# Patient Record
Sex: Male | Born: 1985 | Race: White | Hispanic: No | Marital: Single | Smoking: Current every day smoker
Health system: Southern US, Community
[De-identification: ages and names within clinical notes are randomized; demographics above are authoritative.]

## PROBLEM LIST (undated history)

## (undated) DIAGNOSIS — J45909 Unspecified asthma, uncomplicated: Secondary | ICD-10-CM

## (undated) DIAGNOSIS — Z973 Presence of spectacles and contact lenses: Secondary | ICD-10-CM

## (undated) DIAGNOSIS — T7840XA Allergy, unspecified, initial encounter: Secondary | ICD-10-CM

## (undated) DIAGNOSIS — F419 Anxiety disorder, unspecified: Secondary | ICD-10-CM

## (undated) DIAGNOSIS — F988 Other specified behavioral and emotional disorders with onset usually occurring in childhood and adolescence: Secondary | ICD-10-CM

## (undated) HISTORY — PX: WISDOM TOOTH EXTRACTION: SHX21

## (undated) HISTORY — DX: Unspecified asthma, uncomplicated: J45.909

## (undated) HISTORY — DX: Other specified behavioral and emotional disorders with onset usually occurring in childhood and adolescence: F98.8

## (undated) HISTORY — PX: DENTAL SURGERY: SHX609

## (undated) HISTORY — DX: Anxiety disorder, unspecified: F41.9

## (undated) HISTORY — DX: Allergy, unspecified, initial encounter: T78.40XA

## (undated) HISTORY — DX: Presence of spectacles and contact lenses: Z97.3

---
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
## 2011-08-29 ENCOUNTER — Encounter (INDEPENDENT_AMBULATORY_CARE_PROVIDER_SITE_OTHER): Payer: Self-pay | Admitting: Surgery

## 2011-08-30 ENCOUNTER — Encounter (INDEPENDENT_AMBULATORY_CARE_PROVIDER_SITE_OTHER): Payer: Self-pay | Admitting: General Surgery

## 2011-08-30 ENCOUNTER — Ambulatory Visit (INDEPENDENT_AMBULATORY_CARE_PROVIDER_SITE_OTHER): Payer: 59 | Admitting: General Surgery

## 2011-08-30 VITALS — BP 122/74 | HR 68 | Temp 97.8°F | Resp 18 | Ht 74.0 in | Wt 206.2 lb

## 2011-08-30 DIAGNOSIS — L0591 Pilonidal cyst without abscess: Secondary | ICD-10-CM

## 2011-08-30 DIAGNOSIS — L0592 Pilonidal sinus without abscess: Secondary | ICD-10-CM

## 2011-08-30 NOTE — Patient Instructions (Signed)
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 It is not infected at this time. It is unlikely that this will heal spontaneously. This will predispose  to infections in the future.  We have discussed definitive surgery and what is involved and that. This surgery has been offered to you electively.   If you decide to have this surgery please call the office and return to see me for a preop visit.   Pilonidal Cyst A pilonidal cyst occurs when hairs get trapped (ingrown) beneath the skin in the crease between the buttocks over your sacrum (the bone under that crease). Pilonidal cysts are most common in young men with a lot of body hair. When the cyst is ruptured (breaks) or leaking, fluid from the cyst may cause burning and itching. If the cyst becomes infected, it causes a painful swelling filled with pus (abscess). The pus and trapped hairs need to be removed (often by lancing) so that the infection can heal. However, recurrence is common and an operation may be needed to remove the cyst. HOME CARE INSTRUCTIONS   If the cyst was NOT INFECTED:   Keep the area clean and dry. Bathe or shower daily. Wash the area well with a germ-killing soap. Warm tub baths may help prevent infection and help with drainage. Dry the area well with a towel.   Avoid tight clothing to keep area as moisture free as possible.   Keep area between buttocks as free of hair as possible. A depilatory may be used.   If the cyst WAS INFECTED and needed to be drained:   Your caregiver packed the wound with gauze to keep the wound open. This allows the wound to heal from the inside outwards and continue draining.   Return for a wound check in 1 day or as suggested.   If you take tub baths or showers, repack the wound with gauze following them. Sponge baths (at the sink) are a good alternative.   If an antibiotic was ordered to fight the infection, take as directed.   Only take over-the-counter or prescription medicines for pain,  discomfort, or fever as directed by your caregiver.   After the drain is removed, use sitz baths for 20 minutes 4 times per day. Clean the wound gently with mild unscented soap, pat dry, and then apply a dry dressing.  SEEK MEDICAL CARE IF:   You have increased pain, swelling, redness, drainage, or bleeding from the area.   You have a fever.   You have muscles aches, dizziness, or a general ill feeling.  Document Released: 05/06/2000 Document Revised: 04/28/2011 Document Reviewed: 07/04/2008 [facility] Patient Information 2012 [place], [place].

## 2011-08-30 NOTE — Progress Notes (Signed)
Patient ID: Malik Becker, male   DOB: Dec 04, 1985, 26 y.o.   MRN: [id number]  Chief Complaint  Patient presents with  . Follow-up    Pilonidal cyst    HPI Malik Becker is a 26 y.o. male.  He is referred by Dr. Para Skeans for evaluation of a chronic pilonidal sinus.  The patient states that he's had an opening in the presacral area for 10 years. It has drained once. It will occasionally get painful and then resolve. Occasionally sees a little bit of blood on his underwear. He has never had surgical intervention. He denies having any infection in this area. He denies any rectal disease. He is not in acute distress today.  He simply states he has not shown this to any one until last week when he showed up to Dr. Terri Piedra who told him he had pilonidal disease.  Patient is otherwise healthy. He is single. His mother lives in the area. HPI  Past Medical History  Diagnosis Date  . Pilonidal cyst     Past Surgical History  Procedure Date  . Finger fracture surgery 2006    pinky    Family History  Problem Relation Age of Onset  . Asthma Mother   . Stroke Mother   . Diabetes Maternal Grandmother   . Diabetes Paternal Grandmother     boarderline  . Alzheimer's disease Paternal Grandfather   . Cancer Other     breast  . Glaucoma Other     Social History History  Substance Use Topics  . Smoking status: Current Everyday Smoker  . Smokeless tobacco: Never Used  . Alcohol Use: Yes     rare    No Known Allergies  Current Outpatient Prescriptions  Medication Sig Dispense Refill  . dextroamphetamine (DEXTROSTAT) 10 MG tablet Take 10 mg by mouth 3 (three) times daily. Two pills per dosage      . FLUoxetine HCl 60 MG TABS Take by mouth daily.        Review of Systems Review of Systems  Constitutional: Negative for fever, chills and unexpected weight change.  HENT: Negative for hearing loss, congestion, sore throat, trouble swallowing and voice change.   Eyes: Negative for  visual disturbance.  Respiratory: Negative for cough and wheezing.   Cardiovascular: Negative for chest pain, palpitations and leg swelling.  Gastrointestinal: Negative for nausea, vomiting, abdominal pain, diarrhea, constipation, blood in stool, abdominal distention, anal bleeding and rectal pain.  Genitourinary: Negative for hematuria and difficulty urinating.  Musculoskeletal: Negative for arthralgias.  Skin: Positive for wound. Negative for rash.  Neurological: Negative for seizures, syncope, weakness and headaches.  Hematological: Negative for adenopathy. Does not bruise/bleed easily.  Psychiatric/Behavioral: Negative for confusion.    Blood pressure 122/74, pulse 68, temperature 97.8 F (36.6 C), temperature source Temporal, resp. rate 18, height 6\' 2"  (1.88 m), weight 206 lb 4 oz (93.554 kg).  Physical Exam Physical Exam  Constitutional: He is oriented to person, place, and time. He appears well-developed and well-nourished. No distress.  Eyes: EOM are normal. No scleral icterus.  Neck: Normal range of motion. Neck supple. No JVD present. No tracheal deviation present. No thyromegaly present.  Cardiovascular: Normal rate, regular rhythm, normal heart sounds and intact distal pulses.   No murmur heard. Pulmonary/Chest: Effort normal and breath sounds normal. No stridor. No respiratory distress. He has no wheezes. He has no rales. He exhibits no tenderness.  Abdominal: Soft. Bowel sounds are normal. He exhibits no distension and no mass.  There is no tenderness. There is no rebound and no guarding.  Musculoskeletal: Normal range of motion. He exhibits no edema and no tenderness.  Lymphadenopathy:    He has no cervical adenopathy.  Neurological: He is alert and oriented to person, place, and time. He has normal reflexes. Coordination normal.  Skin: Skin is warm and dry. No rash noted. He is not diaphoretic. No erythema. No pallor.       There are no scars or chronic draining areas in  either the axilla or the inguinal areas. In the pilonidal area there are 2 openings in the midline and one is approximately 4 cm away from the anal verge and another one is about 8 or 9 cm away from the anal verge. There is no erythema or purulence. I tried to probe these both toward the rectum and proximally and could not force the probe very far. This was a little bit painful. There is no mass.  Psychiatric: He has a normal mood and affect. His behavior is normal. Judgment and thought content normal.    Data Reviewed None available  Assessment    Chronic pilonidal sinuses. Not actively infected at this time.    Plan    I explained the diagnosis and the pathophysiology of pilonidal disease. I told him that to treat these definitively would require excision and healing by secondary intention and this would take 6-8 weeks of wound packing. I discussed the natural history of this process if nothing is done including the possibility of painful abscess he is an eventual surgery.  At this time he wants to go home and think about definitive surgery. He does not want to do that at this time.  He was offered this surgery at this time if he so shows. He will call me p.r.n.       Angelia Mould. Derrell Lolling, M.D., [facility] General and Minimally invasive Surgery Breast and Colorectal Surgery Office:   [phone] Pager:   [phone]  08/30/2011, 3:17 PM

## 2016-12-08 DIAGNOSIS — L0591 Pilonidal cyst without abscess: Secondary | ICD-10-CM | POA: Diagnosis not present

## 2016-12-21 HISTORY — PX: PILONIDAL CYST DRAINAGE: SHX743

## 2017-01-26 ENCOUNTER — Ambulatory Visit (INDEPENDENT_AMBULATORY_CARE_PROVIDER_SITE_OTHER): Payer: 59 | Admitting: Medical

## 2017-01-26 ENCOUNTER — Encounter: Payer: Self-pay | Admitting: Medical

## 2017-01-26 VITALS — BP 116/76 | HR 65 | Ht 73.0 in | Wt 216.0 lb

## 2017-01-26 DIAGNOSIS — K4090 Unilateral inguinal hernia, without obstruction or gangrene, not specified as recurrent: Secondary | ICD-10-CM | POA: Diagnosis not present

## 2017-01-26 DIAGNOSIS — Z7189 Other specified counseling: Secondary | ICD-10-CM

## 2017-01-26 DIAGNOSIS — Z0000 Encounter for general adult medical examination without abnormal findings: Secondary | ICD-10-CM | POA: Diagnosis not present

## 2017-01-26 DIAGNOSIS — Z136 Encounter for screening for cardiovascular disorders: Secondary | ICD-10-CM

## 2017-01-26 DIAGNOSIS — Z2821 Immunization not carried out because of patient refusal: Secondary | ICD-10-CM

## 2017-01-26 DIAGNOSIS — Z79899 Other long term (current) drug therapy: Secondary | ICD-10-CM

## 2017-01-26 DIAGNOSIS — Z113 Encounter for screening for infections with a predominantly sexual mode of transmission: Secondary | ICD-10-CM | POA: Diagnosis not present

## 2017-01-26 DIAGNOSIS — F419 Anxiety disorder, unspecified: Secondary | ICD-10-CM

## 2017-01-26 DIAGNOSIS — Z7185 Encounter for immunization safety counseling: Secondary | ICD-10-CM

## 2017-01-26 LAB — POCT URINALYSIS DIP (PROADVANTAGE DEVICE)
Bilirubin, UA: NEGATIVE
Blood, UA: NEGATIVE
Glucose, UA: NEGATIVE mg/dL
Ketones, POC UA: NEGATIVE mg/dL
LEUKOCYTES UA: NEGATIVE
Nitrite, UA: NEGATIVE
PROTEIN UA: NEGATIVE mg/dL
Specific Gravity, Urine: 1.03
UUROB: NEGATIVE
pH, UA: 6 (ref 5.0–8.0)

## 2017-01-26 MED ORDER — PROPRANOLOL HCL 20 MG PO TABS: 20.0000 mg | ORAL_TABLET | Freq: Two times a day (BID) | ORAL | 1 refills | Status: DC

## 2017-01-26 NOTE — Progress Notes (Signed)
Subjective:   HPI  Malik Becker is a 31 y.o. male who presents for new patient physical Chief Complaint  Patient presents with  . New Patient (Initial Visit)    new pt and physical ,  possible hernia in groin     Medical care team includes: Malik Becker, Malik Baloavid S, PA-C here for primary care establishing today Dr. Milagros Evenerupinder Becker, psychiatry Dentist Eye doctor  [place], been in [place].  Was seeing PCP in [place] prior.  Possible inguinal hernia back in high school that hasn't been repaired.   No bulging, no pain though.     Has always dealt with anxiety problems, but lately symptom are showing.  Has shakes at times, armpits sweat and showing in armpits.  curios about trying a Beta blocker.  He is on stimulant medication.    Reviewed their medical, surgical, family, social, medication, and allergy history and updated chart as appropriate.  Past Medical History:  Diagnosis Date  . Allergy   . Childhood asthma   . Pilonidal cyst   . Wears contact lenses     Past Surgical History:  Procedure Laterality Date  . DENTAL SURGERY     implant  . FINGER FRACTURE SURGERY  2006   pinky, boxers fracture  . PILONIDAL CYST DRAINAGE  12/2016    Social History   Social History  . Marital status: Single    Spouse name: N/A  . Number of children: N/A  . Years of education: N/A   Occupational History  . Not on file.   Social History Main Topics  . Smoking status: Current Every Day Smoker    Packs/day: 0.25    Years: 3.00  . Smokeless tobacco: Former NeurosurgeonUser    Types: Chew  . Alcohol use 3.0 oz/week    5 Cans of beer per week  . Drug use: No  . Sexual activity: Not on file   Other Topics Concern  . Not on file   Social History Narrative   Lives alone.  No children.  Working on IT trainerCPA, works for [employer], Loss adjuster, charteredauditing.   Exercise - gym, and plays on baseball league, softball, golf, outdoors activities.   01/2017    Family History  Problem Relation  Age of Onset  . Asthma Mother   . Stroke Mother   . Arrhythmia Mother   . Sleep apnea Mother   . Heart disease Mother        arrhythmia  . Diabetes Maternal Grandmother   . Diabetes Paternal Grandmother        boarderline  . COPD Paternal Grandmother        lung  . Alzheimer'Becker disease Paternal Grandfather   . Cancer Other        breast, great aunt  . Glaucoma Other   . Asthma Brother      Current Outpatient Prescriptions:  .  amphetamine-dextroamphetamine (ADDERALL) 30 MG tablet, Take 30 mg by mouth 2 (two) times daily., Disp: , Rfl:   No Known Allergies   Review of Systems Constitutional: -fever, -chills, +sweats, -unexpected weight change, -decreased appetite, -fatigue Allergy: -sneezing, -itching, -congestion Dermatology: -changing moles, --rash, -lumps ENT: -runny nose, -ear pain, -sore throat, -hoarseness, -sinus pain, -teeth pain, - ringing in ears, -hearing loss, -nosebleeds Cardiology: -chest pain, -palpitations, -swelling, -difficulty breathing when lying flat, -waking up short of breath Respiratory: -cough, -shortness of breath, -difficulty breathing with exercise or exertion, -wheezing, -coughing up blood Gastroenterology: -abdominal pain, -nausea, -vomiting, -diarrhea, -constipation, -blood  in stool, -changes in bowel movement, -difficulty swallowing or eating Hematology: -bleeding, -bruising  Musculoskeletal: -joint aches, -muscle aches, -joint swelling, -back pain, -neck pain, -cramping, -changes in gait Ophthalmology: denies vision changes, eye redness, itching, discharge Urology: -burning with urination, -difficulty urinating, -blood in urine, -urinary frequency, -urgency, -incontinence Neurology: -headache, -weakness, -tingling, -numbness, -memory loss, -falls, -dizziness Psychology: -depressed mood, -agitation, -sleep problems, +anxiety     Objective:   BP 116/76   Pulse 65   Ht  (1.854 m)   Wt 216 lb (98 kg)   SpO2 96%   BMI 28.50 kg/m    General appearance: alert, no distress, WD/WN, Caucasian male Skin: scattered macules no worrisome lesions HEENT: normocephalic, conjunctiva/corneas normal, sclerae anicteric, PERRLA, EOMi, nares patent, no discharge or erythema, pharynx normal Oral cavity: MMM, tongue normal, teeth normal Neck: supple, no lymphadenopathy, no thyromegaly, no masses, normal ROM, no bruits Chest: non tender, normal shape and expansion Heart: RRR, normal S1, S2, no murmurs Lungs: CTA bilaterally, no wheezes, rhonchi, or rales Abdomen: +bs, soft, non tender, non distended, no masses, no hepatomegaly, no splenomegaly, no bruits Back: non tender, normal ROM, no scoliosis Musculoskeletal: upper extremities non tender, no obvious deformity, normal ROM throughout, lower extremities non tender, no obvious deformity, normal ROM throughout Extremities: no edema, no cyanosis, no clubbing Pulses: 2+ symmetric, upper and lower extremities, normal cap refill Neurological: alert, oriented x 3, CN2-12 intact, strength normal upper extremities and lower extremities, sensation normal throughout, DTRs 2+ throughout, no cerebellar signs, gait normal Psychiatric: normal affect, behavior normal, pleasant  GU: normal male external genitalia,uncircumcised, nontender, no masses, small reducible right inguinal hernia, no lymphadenopathy Rectal: deferred   Adult ECG Report  Indication: anxiety, physical  Rate: 76 bpm  Rhythm: normal sinus rhythm  QRS Axis: 49 degrees  PR Interval:  QRS Duration: 80ms  QTc:  Conduction Disturbances: none  Other Abnormalities: none  Patient'Becker cardiac risk factors are: none.  EKG comparison: none  Narrative Interpretation: normal EKG    Assessment and Plan :    Encounter Diagnoses  Name Primary?  . Routine general medical examination at a health care facility Yes  . Vaccine counseling   . Influenza vaccination declined   . Screen for STD (sexually transmitted disease)   .  High risk medication use   . Anxiety   . Unilateral inguinal hernia without obstruction or gangrene, recurrence not specified     Physical exam - discussed and counseled on healthy lifestyle, diet, exercise, preventative care, vaccinations, sick and well care, proper use of emergency dept and after hours care, and addressed their concerns.    Health screening: See your eye doctor yearly for routine vision care. See your dentist yearly for routine dental care including hygiene visits twice yearly.  Discussed STD testing, discussed prevention, condom use, means of transmission  Cancer screening Discussed monthly testicular cancer screening  Vaccinations: Counseled on the following vaccines:  influenza and Td.  He will get me copy of last Td vaccine.  Acute issues discussed: Begin trial of propanolol for anxiety, sweaty armpits, shakes, performance anxiety  Hernia - reassured, discussed possible signs to watch for in the future for worsening hernia  Separate significant chronic issues discussed: Anxiety, ADD - f/u with psychiatry  Malick was seen today for new patient (initial visit).  Diagnoses and all orders for this visit:  Routine general medical examination at a health care facility -     POCT Urinalysis DIP (Proadvantage Device) -  Comprehensive metabolic panel -     Lipid panel -     CBC -     TSH -     EKG 12-Lead -     HIV antibody -     RPR -     GC/Chlamydia Probe Amp  Vaccine counseling  Influenza vaccination declined  Screen for STD (sexually transmitted disease) -     HIV antibody -     RPR -     GC/Chlamydia Probe Amp  High risk medication use -     EKG 12-Lead  Anxiety -     EKG 12-Lead  Unilateral inguinal hernia without obstruction or gangrene, recurrence not specified  Other orders -     propranolol (INDERAL) 20 MG tablet; Take 1 tablet (20 mg total) by mouth 2 (two) times daily.  Follow-up pending labs, yearly for physical

## 2017-01-27 LAB — COMPREHENSIVE METABOLIC PANEL
AG Ratio: 1.9 (calc) (ref 1.0–2.5)
ALBUMIN MSPROF: 4.5 g/dL (ref 3.6–5.1)
ALT: 18 U/L (ref 9–46)
AST: 15 U/L (ref 10–40)
Alkaline phosphatase (APISO): 26 U/L — ABNORMAL LOW (ref 40–115)
BUN: 11 mg/dL (ref 7–25)
CHLORIDE: 102 mmol/L (ref 98–110)
CO2: 30 mmol/L (ref 20–32)
CREATININE: 1.07 mg/dL (ref 0.60–1.35)
Calcium: 9.7 mg/dL (ref 8.6–10.3)
GLOBULIN: 2.4 g/dL (ref 1.9–3.7)
GLUCOSE: 82 mg/dL (ref 65–99)
POTASSIUM: 4.3 mmol/L (ref 3.5–5.3)
SODIUM: 138 mmol/L (ref 135–146)
TOTAL PROTEIN: 6.9 g/dL (ref 6.1–8.1)
Total Bilirubin: 0.7 mg/dL (ref 0.2–1.2)

## 2017-01-27 LAB — LIPID PANEL
CHOL/HDL RATIO: 4.3 (calc) (ref <5.0)
Cholesterol: 167 mg/dL (ref <200)
HDL: 39 mg/dL — AB (ref >40)
LDL Cholesterol (Calc): 106 mg/dL (calc) — ABNORMAL HIGH
NON-HDL CHOLESTEROL (CALC): 128 mg/dL (ref <130)
Triglycerides: 127 mg/dL (ref <150)

## 2017-01-27 LAB — C. TRACHOMATIS/N. GONORRHOEAE RNA
C. TRACHOMATIS RNA, TMA: NOT DETECTED
N. GONORRHOEAE RNA, TMA: NOT DETECTED

## 2017-01-27 LAB — HIV ANTIBODY (ROUTINE TESTING W REFLEX): HIV 1&2 Ab, 4th Generation: NONREACTIVE

## 2017-01-27 LAB — CBC
HCT: 48.3 % (ref 38.5–50.0)
Hemoglobin: 16.3 g/dL (ref 13.2–17.1)
MCH: 30.2 pg (ref 27.0–33.0)
MCHC: 33.7 g/dL (ref 32.0–36.0)
MCV: 89.6 fL (ref 80.0–100.0)
MPV: 10.6 fL (ref 7.5–12.5)
PLATELETS: 268 10*3/uL (ref 140–400)
RBC: 5.39 10*6/uL (ref 4.20–5.80)
RDW: 12 % (ref 11.0–15.0)
WBC: 6.4 10*3/uL (ref 3.8–10.8)

## 2017-01-27 LAB — TSH: TSH: 1.37 m[IU]/L (ref 0.40–4.50)

## 2017-01-27 LAB — RPR: RPR Ser Ql: NONREACTIVE

## 2017-02-21 ENCOUNTER — Encounter: Payer: Self-pay | Admitting: Medical

## 2017-02-21 ENCOUNTER — Ambulatory Visit (INDEPENDENT_AMBULATORY_CARE_PROVIDER_SITE_OTHER): Payer: 59 | Admitting: Medical

## 2017-02-21 VITALS — BP 120/80 | HR 80 | Wt 221.6 lb

## 2017-02-21 DIAGNOSIS — Z2821 Immunization not carried out because of patient refusal: Secondary | ICD-10-CM | POA: Diagnosis not present

## 2017-02-21 DIAGNOSIS — F418 Other specified anxiety disorders: Secondary | ICD-10-CM

## 2017-02-21 DIAGNOSIS — R61 Generalized hyperhidrosis: Secondary | ICD-10-CM

## 2017-02-21 DIAGNOSIS — M25511 Pain in right shoulder: Secondary | ICD-10-CM

## 2017-02-21 DIAGNOSIS — F411 Generalized anxiety disorder: Secondary | ICD-10-CM | POA: Diagnosis not present

## 2017-02-21 MED ORDER — PROPRANOLOL HCL 60 MG PO TABS: 60.0000 mg | ORAL_TABLET | Freq: Two times a day (BID) | ORAL | 0 refills | Status: DC

## 2017-02-21 NOTE — Progress Notes (Signed)
Subjective: Chief Complaint  Patient presents with  . Follow-up    follow up  new meds, shoulder pain    Here for f/u.   I saw him as a new patient recently.  At that visit we discussed performance anxiety, anxiety in general, shakes, excessive sweating.   He sees Dr. Evelene Croon for psychiatry, on stimulant for ADD, but we add propranolol last visit for the other symptoms.  So far no improve on  BID.    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  Recently played 3 games back to back this past week, was on 3rd base doing a lot of long throws.  He also pitched in high school.   He notes for the past week had some pain in right shoulder.  Its better the last few days.  No prior surgery or xray of shoulder.  No swelling.  No weakness. No parerethises. No other aggravating or relieving factors. No other complaint.  Past Medical History:  Diagnosis Date  . Allergy   . Childhood asthma   . Pilonidal cyst   . Wears contact lenses    Current Outpatient Prescriptions on File Prior to Visit  Medication Sig Dispense Refill  . amphetamine-dextroamphetamine (ADDERALL) 30 MG tablet Take 30 mg by mouth 2 (two) times daily.     No current facility-administered medications on file prior to visit.    ROS as in subjective   Objective: BP 120/80   Pulse 80   Wt 221 lb 9.6 oz (100.5 kg)   SpO2 97%   BMI 29.24 kg/m   Gen: wd, wn, nad Psych: pleasant, good eye contact Right shoulder nontender, no deformity, no step off, no swelling.  There is mild pain noted with labral test and resisted shoulder flexion, but no laxity, normal ROM otherwise, no pain otherwise Arms neurovascularly intact   Assessment: Encounter Diagnoses  Name Primary?  . Generalized anxiety disorder Yes  . Performance anxiety   . Excessive sweating   . Acute pain of right shoulder     Plan: Anxiety - c/t therapy through Dr. Evelene Croon  Performance anxiety, excessive sweats - increase propranolol.  He has many  tablets left.   increase to  BID for a week, the  BID for several days, and call or my chart back within 2 weeks to give me symptoms update. Consider adding Oxybutynin for sweats  Shoulder pain - discussed symptoms, concerns, exam findings.    I suspect tendonitis from the recent triple games back to back, over use injury.   Advised relative rest, ice and NSAIDs for the next 4-5 days, then gradual return to activity.  Avoid overuse in the future as discussed.  demonstrated rotator cuff exercise to use to keep the shoulder toned and strong  F/u with call back in 2 week  Kieffer was seen today for follow-up.  Diagnoses and all orders for this visit:  Generalized anxiety disorder  Performance anxiety  Excessive sweating  Acute pain of right shoulder

## 2022-08-09 ENCOUNTER — Ambulatory Visit: Payer: 59 | Admitting: Medical

## 2022-08-09 ENCOUNTER — Encounter: Payer: Self-pay | Admitting: Medical

## 2022-08-09 VITALS — BP 124/78 | HR 68 | Ht 74.0 in | Wt 241.4 lb

## 2022-08-09 DIAGNOSIS — Z820 Family history of epilepsy and other diseases of the nervous system: Secondary | ICD-10-CM

## 2022-08-09 DIAGNOSIS — Z1329 Encounter for screening for other suspected endocrine disorder: Secondary | ICD-10-CM

## 2022-08-09 DIAGNOSIS — F419 Anxiety disorder, unspecified: Secondary | ICD-10-CM

## 2022-08-09 DIAGNOSIS — Z79899 Other long term (current) drug therapy: Secondary | ICD-10-CM

## 2022-08-09 DIAGNOSIS — R0683 Snoring: Secondary | ICD-10-CM

## 2022-08-09 DIAGNOSIS — G479 Sleep disorder, unspecified: Secondary | ICD-10-CM

## 2022-08-09 DIAGNOSIS — Z13220 Encounter for screening for lipoid disorders: Secondary | ICD-10-CM | POA: Diagnosis not present

## 2022-08-09 DIAGNOSIS — R8290 Unspecified abnormal findings in urine: Secondary | ICD-10-CM | POA: Diagnosis not present

## 2022-08-09 DIAGNOSIS — Z0000 Encounter for general adult medical examination without abnormal findings: Secondary | ICD-10-CM

## 2022-08-09 DIAGNOSIS — R5383 Other fatigue: Secondary | ICD-10-CM | POA: Diagnosis not present

## 2022-08-09 DIAGNOSIS — F988 Other specified behavioral and emotional disorders with onset usually occurring in childhood and adolescence: Secondary | ICD-10-CM

## 2022-08-09 DIAGNOSIS — Z113 Encounter for screening for infections with a predominantly sexual mode of transmission: Secondary | ICD-10-CM

## 2022-08-09 DIAGNOSIS — R0681 Apnea, not elsewhere classified: Secondary | ICD-10-CM

## 2022-08-09 DIAGNOSIS — F32A Depression, unspecified: Secondary | ICD-10-CM

## 2022-08-09 LAB — POCT URINALYSIS DIP (PROADVANTAGE DEVICE)
Glucose, UA: NEGATIVE mg/dL
Ketones, POC UA: NEGATIVE mg/dL
Leukocytes, UA: NEGATIVE
Nitrite, UA: NEGATIVE
Protein Ur, POC: NEGATIVE mg/dL
Specific Gravity, Urine: 1.03
Urobilinogen, Ur: 0.2
pH, UA: 6 (ref 5.0–8.0)

## 2022-08-09 LAB — COMPREHENSIVE METABOLIC PANEL
Albumin: 4.5 g/dL (ref 4.1–5.1)
Calcium: 9.6 mg/dL (ref 8.7–10.2)
Creatinine, Ser: 1.11 mg/dL (ref 0.76–1.27)
Glucose: 99 mg/dL (ref 70–99)
Potassium: 4.3 mmol/L (ref 3.5–5.2)

## 2022-08-09 LAB — POCT UA - MICROSCOPIC ONLY
Bacteria, U Microscopic: 0
Casts, Ur, LPF, POC: 0
Crystals, Ur, HPF, POC: 0
Epithelial cells, urine per micros: 0
Mucus, UA: 0
RBC, Urine, Miroscopic: 1 (ref 0–2)
WBC, Ur, HPF, POC: 0 (ref 0–5)
Yeast, UA: 0

## 2022-08-09 LAB — LIPID PANEL: Triglycerides: 171 mg/dL — ABNORMAL HIGH (ref 0–149)

## 2022-08-09 LAB — HEPATITIS C ANTIBODY

## 2022-08-09 LAB — RPR+HIV+GC+CT PANEL

## 2022-08-09 LAB — CBC: Hemoglobin: 16.7 g/dL (ref 13.0–17.7)

## 2022-08-09 LAB — TSH

## 2022-08-09 NOTE — Progress Notes (Signed)
Subjective:   HPI  Malik Becker is a 37 y.o. male who presents for Chief Complaint  Patient presents with   other    New pt. Est. Cyst is better does need looked at today, sleep issues, trouble falling and staying asleep,     Patient Care Team: Valery Chance, Leward Quan as PCP - General (Family Medicine) Dr. Chucky May, psychiatry  Concerns: Here to reestablish care.  He was seen here back in 2018.  For the recent several years he has primarily been seeing psychiatry for his anxiety, depression, and ADD issues.  Compliant with his medications.  Has a lot of trouble sleeping.  Wakes up constantly throughout the night.  Can usually get to sleep but sometimes problems getting to sleep.  Has trouble staying asleep.   Has tried medication to help with sleep prior, but it didn't help.  Was more tired when he awoke.  It may have been trazodone.  Snore.  There has been witnessed apnea.  No prior sleep study  Fasting.   He also wants testosterone checked.  His brother was diagnosed with low testosterone.  He also wants sleep eval since there is family history of sleep apnea mother and grandmother  Reviewed their medical, surgical, family, social, medication, and allergy history and updated chart as appropriate.  No Known Allergies  Past Medical History:  Diagnosis Date   ADD (attention deficit disorder)    Allergy    Anxiety and depression    Childhood asthma    Pilonidal cyst    Wears contact lenses     Current Outpatient Medications on File Prior to Visit  Medication Sig Dispense Refill   amphetamine-dextroamphetamine (ADDERALL) 30 MG tablet Take 30 mg by mouth 2 (two) times daily.     buPROPion ER (WELLBUTRIN SR) 100 MG 12 hr tablet Take 100 mg by mouth daily.     FLUoxetine (PROZAC) 20 MG tablet Take 20 mg by mouth daily.     No current facility-administered medications on file prior to visit.      Current Outpatient Medications:    amphetamine-dextroamphetamine  (ADDERALL) 30 MG tablet, Take 30 mg by mouth 2 (two) times daily., Disp: , Rfl:    buPROPion ER (WELLBUTRIN SR) 100 MG 12 hr tablet, Take 100 mg by mouth daily., Disp: , Rfl:    FLUoxetine (PROZAC) 20 MG tablet, Take 20 mg by mouth daily., Disp: , Rfl:   Family History  Problem Relation Age of Onset   Asthma Mother    Stroke Mother    Arrhythmia Mother    Sleep apnea Mother    Heart disease Mother        arrhythmia   Asthma Brother    Diabetes Maternal Grandfather    Diabetes Paternal Grandmother        boarderline   COPD Paternal Grandmother        lung   Alzheimer's disease Paternal Grandfather    Cancer Other        breast, great aunt   Glaucoma Other     Past Surgical History:  Procedure Laterality Date   DENTAL SURGERY     implant   FINGER FRACTURE SURGERY  2006   pinky, boxers fracture   PILONIDAL CYST DRAINAGE  12/2016   WISDOM TOOTH EXTRACTION      Review of Systems  Constitutional:  Positive for malaise/fatigue. Negative for chills, fever and weight loss.  HENT:  Negative for congestion, ear pain, hearing loss, sore throat and  tinnitus.   Eyes:  Negative for blurred vision, pain and redness.  Respiratory:  Negative for cough, hemoptysis and shortness of breath.   Cardiovascular:  Negative for chest pain, palpitations, orthopnea, claudication and leg swelling.  Gastrointestinal:  Negative for abdominal pain, blood in stool, constipation, diarrhea, nausea and vomiting.  Genitourinary:  Negative for dysuria, flank pain, frequency, hematuria and urgency.  Musculoskeletal:  Negative for falls, joint pain and myalgias.  Skin:  Negative for itching and rash.  Neurological:  Negative for dizziness, tingling, speech change, weakness and headaches.  Endo/Heme/Allergies:  Negative for polydipsia. Does not bruise/bleed easily.  Psychiatric/Behavioral:  Negative for depression and memory loss. The patient has insomnia. The patient is not nervous/anxious.         Objective:  BP 124/78   Pulse 68   Ht 6\' 2"  (1.88 m)   Wt 241 lb 6.4 oz (109.5 kg)   BMI 30.99 kg/m   General appearance: alert, no distress, WD/WN, Caucasian male Skin: unremarkable HEENT: normocephalic, conjunctiva/corneas normal, sclerae anicteric, PERRLA, EOMi, nares patent, no discharge or erythema, pharynx normal Oral cavity: MMM, tongue normal, teeth normal Neck: supple, no lymphadenopathy, no thyromegaly, no masses, normal ROM, no bruits Chest: non tender, normal shape and expansion Heart: RRR, normal S1, S2, no murmurs Lungs: CTA bilaterally, no wheezes, rhonchi, or rales Abdomen: +bs, soft, non tender, non distended, no masses, no hepatomegaly, no splenomegaly, no bruits Back: non tender, normal ROM, no scoliosis Musculoskeletal: upper extremities non tender, no obvious deformity, normal ROM throughout, lower extremities non tender, no obvious deformity, normal ROM throughout Extremities: no edema, no cyanosis, no clubbing Pulses: 2+ symmetric, upper and lower extremities, normal cap refill Neurological: alert, oriented x 3, CN2-12 intact, strength normal upper extremities and lower extremities, sensation normal throughout, DTRs 2+ throughout, no cerebellar signs, gait normal Psychiatric: normal affect, behavior normal, pleasant  GU: normal male external genitalia,circumcised, nontender, no masses, no hernia, no lymphadenopathy Rectal: deferred    Assessment and Plan :   Encounter Diagnoses  Name Primary?   Encounter for health maintenance examination in adult Yes   Screening for lipid disorders    Other fatigue    Snoring    Witnessed apneic spells    Screening for thyroid disorder    Family history of sleep apnea    Screen for STD (sexually transmitted disease)    Sleep disturbance    High risk medication use    Anxiety and depression    Attention deficit disorder, unspecified hyperactivity presence     This visit was a preventative care visit, also known  as wellness visit or routine physical.   Topics typically include healthy lifestyle, diet, exercise, preventative care, vaccinations, sick and well care, proper use of emergency dept and after hours care, as well as other concerns.     Separate significant issues discussed: Anxiety, depression, ADD-managed by psychiatry  Sleep issues, sleep disturbance-referral to neurology for sleep evaluation  Fatigue-labs for further evaluation  Screening for STD today    General Recommendations: Continue to return yearly for your annual wellness and preventative care visits.  This gives [place] a chance to discuss healthy lifestyle, exercise, vaccinations, review your chart record, and perform screenings where appropriate.  I recommend you see your eye doctor yearly for routine vision care.  I recommend you see your dentist yearly for routine dental care including hygiene visits twice yearly.   Vaccination  Immunization History  Administered Date(s) Administered   Hepatitis B 02/18/1998, 03/25/1998, 08/05/1998  Tdap 01/01/2007    Vaccine recommendations: Tdap HPV Yearly flu  Vaccines administered today: None, declines   Screening for cancer: Colon cancer screening: Age 67  Testicular cancer screening You should do a monthly self testicular exam if you are between 95-69 years old, and we typically do a testicular exam on the yearly physical for this same age group.   Prostate Cancer screening: The recommended prostate cancer screening test is a blood test called the prostate-specific antigen (PSA) test. PSA is a protein that is made in the prostate. As you age, your prostate naturally produces more PSA. Abnormally high PSA levels may be caused by: Prostate cancer. An enlarged prostate that is not caused by cancer (benign prostatic hyperplasia, or BPH). This condition is very common in older men. A prostate gland infection (prostatitis) or urinary tract infection. Certain medicines  such as male hormones (like testosterone) or other medicines that raise testosterone levels. A rectal exam may be done as part of prostate cancer screening to help provide information about the size of your prostate gland. When a rectal exam is performed, it should be done after the PSA level is drawn to avoid any effect on the results.   Skin cancer screening: Check your skin regularly for new changes, growing lesions, or other lesions of concern Come in for evaluation if you have skin lesions of concern.   Lung cancer screening: If you have a greater than 20 pack year history of tobacco use, then you may qualify for lung cancer screening with a chest CT scan.   Please call your insurance company to inquire about coverage for this test.   Pancreatic cancer:  no current screening test is available or routinely recommended. (risk factors: smoking, overweight or obese, diabetes, chronic pancreatitis, work exposure - dry cleaning, metal working, 36yo>, M>F, Sales promotion account executive, family hx/o, hereditary breast, ovarian, melanoma, lynch, peutz-jeghers).  Symptoms: jaundice, dark urine, light color or greasy stools, itchy skin, belly or back pain, weight loss, poor appetite, nausea, vomiting, liver enlargement, DVT/blood clots.   We currently don't have screenings for other cancers besides breast, cervical, colon, and lung cancers.  If you have a strong family history of cancer or have other cancer screening concerns, please let me know.  Genetic testing referral is an option for individuals with high cancer risk in the family.  There are some other cancer screenings in development currently.   Bone health: Get at least 150 minutes of aerobic exercise weekly Get weight bearing exercise at least once weekly Bone density test:  A bone density test is an imaging test that uses a type of X-ray to measure the amount of calcium and other minerals in your bones. The test may be used to diagnose or screen you  for a condition that causes weak or thin bones (osteoporosis), predict your risk for a broken bone (fracture), or determine how well your osteoporosis treatment is working. The bone density test is recommended for females 89 and older, or females or males [id number] if certain risk factors such as thyroid disease, long term use of steroids such as for asthma or rheumatological issues, vitamin D deficiency, estrogen deficiency, family history of osteoporosis, self or family history of fragility fracture in first degree relative.    Heart health: Get at least 150 minutes of aerobic exercise weekly Limit alcohol It is important to maintain a healthy blood pressure and healthy cholesterol numbers  Heart disease screening: Screening for heart disease includes screening for blood pressure, fasting lipids,  glucose/diabetes screening, BMI height to weight ratio, reviewed of smoking status, physical activity, and diet.    Goals include blood pressure 120/80 or less, maintaining a healthy lipid/cholesterol profile, preventing diabetes or keeping diabetes numbers under good control, not smoking or using tobacco products, exercising most days per week or at least 150 minutes per week of exercise, and eating healthy variety of fruits and vegetables, healthy oils, and avoiding unhealthy food choices like fried food, fast food, high sugar and high cholesterol foods.    Other tests may possibly include EKG test, CT coronary calcium score, echocardiogram, exercise treadmill stress test.      Vascular disease screening: For higher risk individuals including smokers, diabetics, patients with known heart disease or high blood pressure, kidney disease, and others, screening for vascular disease or atherosclerosis of the arteries is available.  Examples may include carotid ultrasound, abdominal aortic ultrasound, ABI blood flow screening in the legs, thoracic aorta screening.   Medical care options: I recommend you  continue to seek care here first for routine care.  We try really hard to have available appointments Monday through Friday daytime hours for sick visits, acute visits, and physicals.  Urgent care should be used for after hours and weekends for significant issues that cannot wait till the next day.  The emergency department should be used for significant potentially life-threatening emergencies.  The emergency department is expensive, can often have long wait times for less significant concerns, so try to utilize primary care, urgent care, or telemedicine when possible to avoid unnecessary trips to the emergency department.  Virtual visits and telemedicine have been introduced since the pandemic started in 2020, and can be convenient ways to receive medical care.  We offer virtual appointments as well to assist you in a variety of options to seek medical care.   Legal  Take the time to do a last will and testament, Advanced Directives including [place] and Living Will documents.  Don't leave your family with burdens that can be handled ahead of time.   Advanced Directives: I recommend you consider completing a [place] and Living Will.   These documents respect your wishes and help alleviate burdens on your loved ones if you were to become terminally ill or be in a position to need those documents enforced.    You can complete Advanced Directives yourself, have them notarized, then have copies made for our office, for you and for anybody you feel should have them in safe keeping.  Or, you can have an attorney prepare these documents.   If you haven't updated your Last Will and Testament in a while, it may be worthwhile having an attorney prepare these documents together and save on some costs.       Spiritual and Emotional Health Keeping a healthy spiritual life can help you better manage your physical health. Your spiritual life can help you to cope with  any issues that may arise with your physical health.  Balance can keep [place] healthy and help [place] to recover.  If you are struggling with your spiritual health there are questions that you may want to ask yourself:  What makes me feel most complete? When do I feel most connected to the rest of the world? Where do I find the most inner strength? What am I doing when I feel whole?  Helpful tips: Being in nature. Some people feel very connected and at peace when they are walking outdoors  or are outside. Helping others. Some feel the largest sense of wellbeing when they are of service to others. Being of service can take on many forms. It can be doing volunteer work, being kind to strangers, or offering a hand to a friend in need. Gratitude. Some people find they feel the most connected when they remain grateful. They may make lists of all the things they are grateful for or say a thank you out loud for all they have.    Emotional Health Are you in tune with your emotional health?  Check out this link: http://www.bray.com/    Financial Health Make sure you use a budget for your personal finances Make sure you are insured against risks (health insurance, life insurance, auto insurance, etc) Save more, spend less Set financial goals If you need help in this area, good resources include counseling through [employer] or other community resources, have a meeting with a Emergency planning/management officer, and a good resource is the [employer]    Kemonte was seen today for other.  Diagnoses and all orders for this visit:  Encounter for health maintenance examination in adult -     Comprehensive metabolic panel -     CBC -     Lipid panel -     RPR+HIV+GC+CT Panel -     Hepatitis C antibody -     Hepatitis B surface antigen -     TSH -     Testosterone -     POCT Urinalysis DIP (Proadvantage Device)  Screening for lipid disorders -     Lipid panel  Other fatigue -      TSH -     Testosterone  Snoring  Witnessed apneic spells  Screening for thyroid disorder -     TSH  Family history of sleep apnea  Screen for STD (sexually transmitted disease) -     RPR+HIV+GC+CT Panel -     Hepatitis C antibody -     Hepatitis B surface antigen  Sleep disturbance  High risk medication use  Anxiety and depression  Attention deficit disorder, unspecified hyperactivity presence     Follow-up pending labs, yearly for physical

## 2022-08-09 NOTE — Addendum Note (Signed)
Addended byLinus Salmons, Itzayana Pardy D on: 08/09/2022 12:03 PM   Modules accepted: Orders

## 2022-08-09 NOTE — Progress Notes (Signed)
Microscopic reveals 1 RBC per field or less, insignificant

## 2022-08-10 ENCOUNTER — Other Ambulatory Visit: Payer: Self-pay | Admitting: Medical

## 2022-08-10 DIAGNOSIS — G479 Sleep disorder, unspecified: Secondary | ICD-10-CM

## 2022-08-10 DIAGNOSIS — F988 Other specified behavioral and emotional disorders with onset usually occurring in childhood and adolescence: Secondary | ICD-10-CM

## 2022-08-10 DIAGNOSIS — R0683 Snoring: Secondary | ICD-10-CM

## 2022-08-10 DIAGNOSIS — R5383 Other fatigue: Secondary | ICD-10-CM

## 2022-08-10 DIAGNOSIS — Z79899 Other long term (current) drug therapy: Secondary | ICD-10-CM

## 2022-08-10 LAB — LIPID PANEL
Chol/HDL Ratio: 5.1 ratio — ABNORMAL HIGH (ref 0.0–5.0)
Cholesterol, Total: 221 mg/dL — ABNORMAL HIGH (ref 100–199)
LDL Chol Calc (NIH): 147 mg/dL — ABNORMAL HIGH (ref 0–99)

## 2022-08-10 LAB — COMPREHENSIVE METABOLIC PANEL
Albumin/Globulin Ratio: 1.8 (ref 1.2–2.2)
Alkaline Phosphatase: 35 IU/L — ABNORMAL LOW (ref 44–121)
CO2: 23 mmol/L (ref 20–29)
Chloride: 100 mmol/L (ref 96–106)

## 2022-08-10 LAB — CBC
Hematocrit: 49.5 % (ref 37.5–51.0)
MCH: 32.1 pg (ref 26.6–33.0)
MCV: 95 fL (ref 79–97)
RBC: 5.2 x10E6/uL (ref 4.14–5.80)
WBC: 6.9 10*3/uL (ref 3.4–10.8)

## 2022-08-10 LAB — RPR+HIV+GC+CT PANEL: RPR Ser Ql: NONREACTIVE

## 2022-08-10 LAB — HEPATITIS B SURFACE ANTIGEN: Hepatitis B Surface Ag: NEGATIVE

## 2022-08-10 NOTE — Progress Notes (Signed)
Labs show normal blood sugar liver kidney and electrolytes.  Blood counts normal.  Thyroid normal.  Testosterone definitely normal.  Negative for hepatitis B, negative for hepatitis C, HIV and syphilis negative.  Still pending gonorrhea and Chlamydia test.  Your main findings was triglycerides and cholesterol too high.  I recommend a low fat / low-cholesterol dietary program such as Whole 30 or [facility]  Recommendations for improving lipids:  Foods TO AVOID or limit - fried foods, high sugar foods, white bread, enriched flour, fast food, red meat, large amounts of cheese, processed foods such as little debbie cakes, cookies, pies, donuts, for example  Foods TO INCLUDE in the diet - whole grains such as whole grain pasta, whole grain bread, barley, steel cut oatmeal (not instant oatmeal), avocado, fish, green leafy vegetables, nuts, increased fiber in diet, and using olive oil in small amounts for cooking or as salad dressing vinaigrette.    Expect phone call about sleep/neurology consult

## 2022-08-12 LAB — COMPREHENSIVE METABOLIC PANEL
ALT: 22 IU/L (ref 0–44)
AST: 23 IU/L (ref 0–40)
BUN/Creatinine Ratio: 9 (ref 9–20)
BUN: 10 mg/dL (ref 6–20)
Bilirubin Total: 0.3 mg/dL (ref 0.0–1.2)
Globulin, Total: 2.5 g/dL (ref 1.5–4.5)
Sodium: 138 mmol/L (ref 134–144)
Total Protein: 7 g/dL (ref 6.0–8.5)
eGFR: 89 mL/min/{1.73_m2} (ref >59)

## 2022-08-12 LAB — CBC
MCHC: 33.7 g/dL (ref 31.5–35.7)
Platelets: 289 10*3/uL (ref 150–450)
RDW: 11.6 % (ref 11.6–15.4)

## 2022-08-12 LAB — LIPID PANEL: HDL: 43 mg/dL (ref >39)

## 2022-08-12 LAB — RPR+HIV+GC+CT PANEL: HIV Screen 4th Generation wRfx: NONREACTIVE

## 2022-08-12 LAB — TESTOSTERONE: Testosterone: 740 ng/dL (ref 264–916)

## 2022-09-14 ENCOUNTER — Ambulatory Visit (INDEPENDENT_AMBULATORY_CARE_PROVIDER_SITE_OTHER): Payer: 59 | Admitting: Neurology

## 2022-09-14 ENCOUNTER — Encounter: Payer: Self-pay | Admitting: Neurology

## 2022-09-14 VITALS — BP 130/88 | HR 85 | Ht 74.0 in | Wt 238.4 lb

## 2022-09-14 DIAGNOSIS — R0689 Other abnormalities of breathing: Secondary | ICD-10-CM

## 2022-09-14 DIAGNOSIS — G4719 Other hypersomnia: Secondary | ICD-10-CM | POA: Diagnosis not present

## 2022-09-14 DIAGNOSIS — R0681 Apnea, not elsewhere classified: Secondary | ICD-10-CM | POA: Diagnosis not present

## 2022-09-14 DIAGNOSIS — R0683 Snoring: Secondary | ICD-10-CM | POA: Diagnosis not present

## 2022-09-14 DIAGNOSIS — G4700 Insomnia, unspecified: Secondary | ICD-10-CM | POA: Diagnosis not present

## 2022-09-14 DIAGNOSIS — Z820 Family history of epilepsy and other diseases of the nervous system: Secondary | ICD-10-CM

## 2022-09-14 DIAGNOSIS — Z9189 Other specified personal risk factors, not elsewhere classified: Secondary | ICD-10-CM

## 2022-09-14 DIAGNOSIS — G478 Other sleep disorders: Secondary | ICD-10-CM

## 2022-09-14 NOTE — Progress Notes (Addendum)
Subjective:    Patient ID: MALVIN MORRISH is a 37 y.o. male.  HPI    Huston Foley, MD, PhD [facility] [address] [address] [place], [place] [address]  Dear Onalee Hua,  I saw your patient, Cassian Torelli, upon your kind request in my sleep clinic today for initial consultation of his sleep disorder, in particular, concern for underlying obstructive sleep apnea.  The patient is unaccompanied today.  As you know, Mr. Condrey is a 38 year old male with an underlying medical history of ADD, anxiety, depression, allergies, history of childhood asthma, and borderline obesity, who reports snoring and excessive daytime somnolence as well as fragmented sleep and witnessed apneas per significant other.  He has a longstanding history of difficulty maintaining sleep, to a lesser degree initiating sleep dating back to high school days even.  He had tried prescription sleep aids, possibly Ambien in the past but had side effects, particularly daytime grogginess and sleepiness.  He has tried over-the-counter medications which did not help.  Over time his snoring has become worse and he has recently woken himself up with a sense of gasping for air, which was concerning to him.  He does not wake up rested.  His mom and maternal grandmother have sleep apnea and have CPAP machines.  His Epworth sleepiness score is 10 out of 24 but this is while he is taking Adderall.  His Epworth sleepiness score is estimated to be more than 15 out of 24 if he were not on Adderall.  He takes his Adderall typically first thing in the morning and around 12:30 PM.  If he takes it later than 1230 or 1-ish, he will have trouble sleeping at night.  His fatigue score is 50 out of 63.  His weight has been more or less stable.  He is working on weight loss.  I reviewed your office note from 08/09/2022. He is followed by psychiatry.  Bedtime is generally between 10:30 PM and 11 PM and rise time is around 7.  He does not  have nightly nocturia and denies any recurrent nocturnal or morning headaches.  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  He works 3 days from home and 2 days in the office.  He has 1 dog in the household.  He has a TV in his bedroom but it is not on at night.  His dog sleeps in the crate in his bedroom.  He is single and lives alone, no children.  He smokes cigarettes.  He drinks alcohol about 1 beer per day.  He drinks caffeine in the form of 1 cup of coffee in the morning and 1 soda with lunch typically.  His Past Medical History Is Significant For: Past Medical History:  Diagnosis Date   ADD (attention deficit disorder)    Allergy    Anxiety and depression    Childhood asthma    Pilonidal cyst    Wears contact lenses     Her Past Surgical History Is Significant For: Past Surgical History:  Procedure Laterality Date   DENTAL SURGERY     implant   FINGER FRACTURE SURGERY  2006   pinky, boxers fracture   PILONIDAL CYST DRAINAGE  12/2016   WISDOM TOOTH EXTRACTION      His Family History Is Significant For: Family History  Problem Relation Age of Onset   Asthma Mother    Stroke Mother    Arrhythmia Mother    Sleep apnea Mother  Heart disease Mother        arrhythmia   Asthma Brother    Sleep apnea Maternal Grandmother    Diabetes Maternal Grandfather    Diabetes Paternal Grandmother        boarderline   COPD Paternal Grandmother        lung   Alzheimer's disease Paternal Grandfather    Cancer Other        breast, great aunt   Glaucoma Other     Her Social History Is Significant For: Social History   Socioeconomic History   Marital status: Single    Spouse name: Not on file   Number of children: Not on file   Years of education: Not on file   Highest education level: Not on file  Occupational History   Not on file  Tobacco Use   Smoking status: Every Day    Packs/day: 0.25    Years: 15.00    Additional pack years: 0.00    Total pack years: 3.75    Types:  Cigarettes   Smokeless tobacco: Former    Types: Associate Professor Use: Some days  Substance and Sexual Activity   Alcohol use: Yes    Alcohol/week: 12.0 standard drinks of alcohol    Types: 12 Cans of beer per week   Drug use: Yes    Types: Marijuana   Sexual activity: Not on file  Other Topics Concern   Not on file  Social History Narrative   Lives alone.  No children.  Working on IT trainer, works for [name], Loss adjuster, chartered.   Exercise -yard work, walking.   07/2022.   Social Determinants of Health   Financial Resource Strain: Not on file  Food Insecurity: Not on file  Transportation Needs: Not on file  Physical Activity: Not on file  Stress: Not on file  Social Connections: Not on file    His Allergies Are:  No Known Allergies:   His Current Medications Are:  Outpatient Encounter Medications as of 09/14/2022  Medication Sig   amphetamine-dextroamphetamine (ADDERALL) 30 MG tablet Take 30 mg by mouth 2 (two) times daily.   buPROPion ER (WELLBUTRIN SR) 100 MG 12 hr tablet Take 100 mg by mouth daily.   FLUoxetine (PROZAC) 20 MG tablet Take 20 mg by mouth daily.   No facility-administered encounter medications on file as of 09/14/2022.  :   Review of Systems:  Out of a complete 14 point review of systems, all are reviewed and negative with the exception of these symptoms as listed below:  Review of Systems  Neurological:        Pt here for sleep consult Pt snores,fatigue  Pt denies hypertension,headaches,sleep study,CPAP machine    ESS:10  FSS:50    Objective:  Neurological Exam  Physical Exam Physical Examination:   Vitals:   09/14/22 1107  BP: 130/88  Pulse: 85    General Examination: The patient is a very pleasant 37 y.o. male in no acute distress. He appears well-developed and well-nourished and well groomed.   HEENT: Normocephalic, atraumatic, pupils are equal, round and reactive to light, extraocular tracking is good without limitation to gaze  excursion or nystagmus noted. Hearing is grossly intact. Face is symmetric with normal facial animation. Speech is clear with no dysarthria noted. There is no hypophonia. There is no lip, neck/head, jaw or voice tremor. Neck is supple with full range of passive and active motion. There are no carotid bruits on auscultation. Oropharynx exam  reveals: mild mouth dryness, good dental hygiene and moderate airway crowding, due to small airway entry and longer uvula, tonsillar size of about 1 to maybe 2+ bilaterally.  Mallampati class II, neck circumference 17 and three-quarter inches.  Minimal overbite.  Tongue protrudes centrally and palate elevates symmetrically.  Chest: Clear to auscultation without wheezing, rhonchi or crackles noted.  Heart: S1+S2+0, regular and normal without murmurs, rubs or gallops noted.   Abdomen: Soft, non-tender and non-distended.  Extremities: There is no pitting edema in the distal lower extremities bilaterally.   Skin: Warm and dry without trophic changes noted.   Musculoskeletal: exam reveals no obvious joint deformities.   Neurologically:  Mental status: The patient is awake, alert and oriented in all 4 spheres. His immediate and remote memory, attention, language skills and fund of knowledge are appropriate. There is no evidence of aphasia, agnosia, apraxia or anomia. Speech is clear with normal prosody and enunciation. Thought process is linear. Mood is normal and affect is normal.  Cranial nerves II - XII are as described above under HEENT exam.  Motor exam: Normal bulk, strength and tone is noted. There is no obvious action or resting tremor.  Fine motor skills and coordination: grossly intact.  Cerebellar testing: No dysmetria or intention tremor. There is no truncal or gait ataxia.  Sensory exam: intact to light touch in the upper and lower extremities.  Gait, station and balance: He stands easily. No veering to one side is noted. No leaning to one side is  noted. Posture is age-appropriate and stance is narrow based. Gait shows normal stride length and normal pace. No problems turning are noted.   Assessment and Plan:  In summary, HAWTHORNE DAY is a very pleasant 37 y.o.-year old male with an underlying medical history of ADD, anxiety, depression, allergies, history of childhood asthma, and borderline obesity, whose history and physical exam are concerning for sleep disordered breathing, particularly obstructive sleep apnea (OSA). A laboratory attended sleep study is typically considered "gold standard" for evaluation of sleep disordered breathing.   I had a long chat with the patient about my findings and the diagnosis of sleep apnea, particularly OSA, its prognosis and treatment options. We talked about medical/conservative treatments, surgical interventions and non-pharmacological approaches for symptom control. I explained, in particular, the risks and ramifications of untreated moderate to severe OSA, especially with respect to developing cardiovascular disease down the road, including congestive heart failure (CHF), difficult to treat hypertension, cardiac arrhythmias (particularly A-fib), neurovascular complications including TIA, stroke and dementia. Even type 2 diabetes has, in part, been linked to untreated OSA. Symptoms of untreated OSA may include (but may not be limited to) daytime sleepiness, nocturia (i.e. frequent nighttime urination), memory problems, mood irritability and suboptimally controlled or worsening mood disorder such as depression and/or anxiety, lack of energy, lack of motivation, physical discomfort, as well as recurrent headaches, especially morning or nocturnal headaches. We talked about the importance of maintaining a healthy lifestyle and striving for healthy weight.  In addition, we talked about the importance of striving for and maintaining good sleep hygiene. I recommended a sleep study at this time. I outlined the differences  between a laboratory attended sleep study which is considered more comprehensive and accurate over the option of a home sleep test (HST); the latter may lead to underestimation of sleep disordered breathing in some instances and does not help with diagnosing upper airway resistance syndrome and is not accurate enough to diagnose primary central sleep apnea typically. I  outlined possible surgical and non-surgical treatment options of OSA, including the use of a positive airway pressure (PAP) device (i.e. CPAP, AutoPAP/APAP or BiPAP in certain circumstances), a custom-made dental device (aka oral appliance, which would require a referral to a specialist dentist or orthodontist typically, and is generally speaking not considered for patients with full dentures or edentulous state), upper airway surgical options, such as traditional UPPP (which is not considered a first-line treatment) or the Inspire device (hypoglossal nerve stimulator, which would involve a referral for consultation with an ENT surgeon, after careful selection, following inclusion criteria - also not first-line treatment). I explained the PAP treatment option to the patient in detail, as this is generally considered first-line treatment.  The patient indicated that he would be willing to try PAP therapy, if the need arises. I explained the importance of being compliant with PAP treatment, not only for insurance purposes but primarily to improve patient's symptoms symptoms, and for the patient's long term health benefit, including to reduce His cardiovascular risks longer-term.    We will pick up our discussion about the next steps and treatment options after testing.  We will keep him posted as to the test results by phone call and/or MyChart messaging where possible.  We will plan to follow-up in sleep clinic accordingly as well.  I answered all his questions today and the patient was in agreement.   I encouraged him to call with any interim  questions, concerns, problems or updates or email [place] through MyChart.  Generally speaking, sleep test authorizations may take up to 2 weeks, sometimes less, sometimes longer, the patient is encouraged to get in touch with [place] if they do not hear back from the sleep lab staff directly within the next 2 weeks.  Thank you very much for allowing me to participate in the care of this nice patient. If I can be of any further assistance to you please do not hesitate to call me at [phone].  Sincerely,   Huston Foley, MD, PhD

## 2022-09-14 NOTE — Patient Instructions (Signed)

## 2022-09-30 ENCOUNTER — Telehealth: Payer: Self-pay | Admitting: Neurology

## 2022-09-30 NOTE — Telephone Encounter (Signed)
NPSG- [employer] pending uploaded notes on the portal.

## 2022-10-03 NOTE — Telephone Encounter (Signed)
Checked status on the portal it is still pending.  

## 2022-10-20 NOTE — Telephone Encounter (Signed)
[employer] denied the NPSG.  Would you like to order a HST?

## 2022-10-20 NOTE — Telephone Encounter (Signed)
HST ordered.

## 2022-10-20 NOTE — Addendum Note (Signed)
Addended by: Huston Foley on: 10/20/2022 10:30 AM   Modules accepted: Orders

## 2022-10-20 NOTE — Telephone Encounter (Signed)
Noted, thank you. HST- [employer] no auth req.  

## 2022-10-27 NOTE — Telephone Encounter (Signed)
MAIL OUT HST- [employer] no auth req   Patient is on the schedule for 11/01/22.

## 2022-11-01 ENCOUNTER — Ambulatory Visit (INDEPENDENT_AMBULATORY_CARE_PROVIDER_SITE_OTHER): Payer: 59 | Admitting: Neurology

## 2022-11-01 DIAGNOSIS — R0683 Snoring: Secondary | ICD-10-CM | POA: Diagnosis not present

## 2022-11-01 DIAGNOSIS — Z9189 Other specified personal risk factors, not elsewhere classified: Secondary | ICD-10-CM

## 2022-11-01 DIAGNOSIS — G4733 Obstructive sleep apnea (adult) (pediatric): Secondary | ICD-10-CM

## 2022-11-01 DIAGNOSIS — R0689 Other abnormalities of breathing: Secondary | ICD-10-CM

## 2022-11-01 DIAGNOSIS — Z820 Family history of epilepsy and other diseases of the nervous system: Secondary | ICD-10-CM

## 2022-11-01 DIAGNOSIS — G4719 Other hypersomnia: Secondary | ICD-10-CM

## 2022-11-01 DIAGNOSIS — G478 Other sleep disorders: Secondary | ICD-10-CM

## 2022-11-01 DIAGNOSIS — G4700 Insomnia, unspecified: Secondary | ICD-10-CM

## 2022-11-01 DIAGNOSIS — R0681 Apnea, not elsewhere classified: Secondary | ICD-10-CM

## 2022-11-08 NOTE — Procedures (Signed)
   [facility]  HOME SLEEP TEST (Watch PAT) REPORT  -  Mail-out Device  STUDY DATE: 11/06/22  DOB: Nov 12, 1985  MRN: [id number]  ORDERING CLINICIAN: Huston Foley, MD, PhD   REFERRING CLINICIAN: Tysinger, Kermit Balo, PA-C   CLINICAL INFORMATION/HISTORY: 37 year old male with an underlying medical history of ADD, anxiety, depression, allergies, history of childhood asthma, and borderline obesity, who reports snoring and excessive daytime somnolence as well as fragmented sleep and witnessed apneas.   Epworth sleepiness score: 10/24.  BMI: 30.6 kg/m  FINDINGS:   Sleep Summary:   Total Recording Time (hours, min): 7 hours, 38 min  Total Sleep Time (hours, min):  6 hours, 24 min  Percent REM (%):    26.5%   Respiratory Indices:   Calculated pAHI (per hour):  16.4/hour         REM pAHI:    32.6/hour       NREM pAHI: 10.4/hour  Central pAHI: 0.2/hour  Oxygen Saturation Statistics:    Oxygen Saturation (%) Mean: 91%   Minimum oxygen saturation (%):                 82%   O2 Saturation Range (%): 82 - 96%    O2 Saturation (minutes) <=88%: 7 min  Pulse Rate Statistics:   Pulse Mean (bpm):    83/min    Pulse Range (58 - 114/min)   IMPRESSION: OSA (obstructive sleep apnea), moderate  RECOMMENDATION:  This home sleep test demonstrates moderate obstructive sleep apnea with a total AHI of 16.4/hour and O2 nadir of 82%. Intermittent snoring was detected, ranging from mild to louder. Treatment with a positive airway pressure (PAP) device is recommended. The patient will be advised to proceed with an autoPAP titration/trial at home for now. A full night titration study may be considered to optimize treatment settings, monitor proper oxygen saturations and aid with improvement of tolerance and adherence, if needed down the road. Alternative treatment options may include a dental device through dentistry or orthodontics in selected patients or Inspire (hypoglossal  nerve stimulator) in carefully selected patients (meeting inclusion criteria).  Concomitant weight loss is recommended (where clinically appropriate). Please note that untreated obstructive sleep apnea may carry additional perioperative morbidity. Patients with significant obstructive sleep apnea should receive perioperative PAP therapy and the surgeons and particularly the anesthesiologist should be informed of the diagnosis and the severity of the sleep disordered breathing. The patient should be cautioned not to drive, work at heights, or operate dangerous or heavy equipment when tired or sleepy. Review and reiteration of good sleep hygiene measures should be pursued with any patient. Other causes of the patient's symptoms, including circadian rhythm disturbances, an underlying mood disorder, medication effect and/or an underlying medical problem cannot be ruled out based on this test. Clinical correlation is recommended.  The patient and his referring provider will be notified of the test results. The patient will be seen in follow up in sleep clinic at [facility].  I certify that I have reviewed the raw data recording prior to the issuance of this report in accordance with the standards of the American Academy of Sleep Medicine (AASM).  INTERPRETING PHYSICIAN:   Huston Foley, MD, PhD Medical Director, [facility]) Diplomat, ABPN (Neurology and Sleep)   [facility] [address], [address] [place], [place] [address] [phone]

## 2022-11-08 NOTE — Addendum Note (Signed)
Addended by: Huston Foley on: 11/08/2022 06:03 PM   Modules accepted: Orders

## 2022-11-09 ENCOUNTER — Telehealth: Payer: Self-pay

## 2022-11-09 NOTE — Telephone Encounter (Signed)
-----   Message from Huston Foley, MD sent at 11/08/2022  6:03 PM EDT ----- Patient referred by PCP, seen by me on 09/14/22, patient had a HST on 11/06/22.    Please call and notify the patient that the recent home sleep test showed obstructive sleep apnea in the moderate range. I recommend treatment in the form of autoPAP, which means, that we don't have to bring him in for a sleep study with CPAP, but will let him start using a so called autoPAP machine at home, which is a CPAP-like machine with self-adjusting pressures. We will send the order to a local DME company (of his choice, or as per insurance requirement). The DME representative will fit him with a mask, educate him on how to use the machine, how to put the mask on, etc. I have placed an order in the chart. Please send the order, talk to patient, send report to referring MD. We will need a FU in sleep clinic for 10 weeks post-PAP set up, please arrange that with me or one of our NPs. Also reinforce the need for compliance with treatment. Thanks,   Huston Foley, MD, PhD [name])

## 2022-11-09 NOTE — Telephone Encounter (Signed)
I called Malik Becker. I advised Malik Becker that Dr. Frances Furbish reviewed their sleep study results and found that Malik Becker has moderate OSA. Dr. Frances Furbish recommends that Malik Becker begin autoPAP. I reviewed PAP compliance expectations with the Malik Becker. Malik Becker is agreeable to starting a CPAP. I advised Malik Becker that an order will be sent to a DME, [employer], and [employer] will call the Malik Becker within about one week after they file with the Malik Becker's insurance. [employer] will show the Malik Becker how to use the machine, fit for masks, and troubleshoot the CPAP if needed. A follow up appt was made for insurance purposes with Dr. Frances Furbish on 01/19/2023. Malik Becker verbalized understanding to arrive 15 minutes early and bring their CPAP. Malik Becker verbalized understanding of results. Malik Becker had no questions at this time but was encouraged to call back if questions arise. I have sent the order to [employer].

## 2023-01-19 ENCOUNTER — Ambulatory Visit: Payer: 59 | Admitting: Neurology

## 2023-01-19 ENCOUNTER — Encounter: Payer: Self-pay | Admitting: Neurology

## 2023-01-19 VITALS — BP 117/72 | HR 85 | Ht 74.0 in | Wt 242.0 lb

## 2023-01-19 DIAGNOSIS — Z789 Other specified health status: Secondary | ICD-10-CM

## 2023-01-19 DIAGNOSIS — G4733 Obstructive sleep apnea (adult) (pediatric): Secondary | ICD-10-CM

## 2023-01-19 NOTE — Patient Instructions (Addendum)
It was nice to see you again today.  I understand, that you are still adjusting to treatment with your AutoPap machine.  Please try to be consistent as the more you use the machine the more you will get tolerant of the treatment and reap more sustained benefit from it over time. Most insurances require that you use your machine at least 4 hours for 70% of the time in the first 3 months.  Your first 3 months are not over yet, this would be in the beginning of October, we still have a good chance for you to be compliant per insurance requirement.  If you are not fully happy with your current mask, you can talk to your DME provider about yet another alternative, there is a lot of mask style and size options and brands.  Please try the sample mask I provided which is a starter pack for the ResMed F40 fullface mask.  Considerations for future use can be the Respironics Amara fullface mask or Mirage Quattro from [employer] from [place].  Let's plan to follow-up in this clinic in about 6 months, you can see one of our nurse practitioners at the time.  I would be happy to review another download in about a month.  Please feel free to send [place] a MyChart message as a reminder.

## 2023-01-19 NOTE — Progress Notes (Signed)
Subjective:    Patient ID: Malik Becker is a 37 y.o. male.  HPI   Interim history:   Malik Becker is a 37 year old male with an underlying medical history of ADD, anxiety, depression, allergies, history of childhood asthma, and borderline obesity, who presents for follow-up consultation of his obstructive sleep apnea, after interim testing and starting home autoPAP therapy. The patient is unaccompanied today. I first met him at the request of his Primary care provider on 09/14/2022, at which time he reported snoring and witnessed apneas, as well as difficulty maintaining sleep and daytime sleepiness.  He was advised to proceed with a sleep study.  He had a home sleep test through our office on 11/06/2022 which showed moderate obstructive sleep apnea with an AHI of 16.4/h, O2 nadir 82% with variable snoring detected.  He was advised to proceed with home AutoPap therapy.  His set up date was 11/22/2022.  He has a ResMed air sense 11 AutoSet machine, his DME company is [employer].   Today, 01/19/2023: I reviewed his AutoPap compliance data from 12/19/2022 through 01/17/2023, which is a total of 30 days, during which time he used his machine 16 days only with percent use days greater than 4 hours at 37%, indicating suboptimal compliance, average usage for days on treatment of 4 hours and 10 minutes, residual AHI at goal at 0.3/h, 95th percentile of pressure at 7.6 cm with a range of 6 to 13 cm with EPR of 3.  Leak on the higher side with the 95th percentile at 28.9 L/min.  In the first month of treatment he had a similar compliance with the exception that average usage was more closer to 3-1/2 hours.  He has had multiple gaps in treatment.  He reports still trying to adjust to treatment, the nasal mask was uncomfortable so he switched to a fullface mask which is tolerable.  He often takes his mask off in the middle of the night and sleeps some without his AutoPap.  Impact, in the morning hours when he goes  to the bathroom, he may come back and sleep a couple of hours without it until its time to wake up.  He feels difficulty sleeping on his back, but feels that the mask sits better when he is not on his sides, he would like to sleep some on his sides.  He is overall motivated to continue with treatment but would like to be more tolerant to it.  He is working on smoking cessation, is contemplating restarting Chantix.  He quit his Wellbutrin and Prozac with his psychiatrist's blessing.  He is currently using a F20 fullface mask.  He tried a nasal pillows interface and nasal cushion mask which did not go well.  He woke up feeling like he was gasping.  He does not suffer from significant mouth dryness. When he has been able to use his machine through the night or most of the night, he does endorse sleeping better and waking up less tired.  Sleep seems to be a little bit more consolidated.  The patient's allergies, current medications, family history, past medical history, past social history, past surgical history and problem list were reviewed and updated as appropriate.   Previously:   09/14/22: (He) reports snoring and excessive daytime somnolence as well as fragmented sleep and witnessed apneas per significant other.  He has a longstanding history of difficulty maintaining sleep, to a lesser degree initiating sleep dating back to high school days even.  He had  tried prescription sleep aids, possibly Ambien in the past but had side effects, particularly daytime grogginess and sleepiness.  He has tried over-the-counter medications which did not help.  Over time his snoring has become worse and he has recently woken himself up with a sense of gasping for air, which was concerning to him.  He does not wake up rested.  His mom and maternal grandmother have sleep apnea and have CPAP machines.  His Epworth sleepiness score is 10 out of 24 but this is while he is taking Adderall.  His Epworth sleepiness score is  estimated to be more than 15 out of 24 if he were not on Adderall.  He takes his Adderall typically first thing in the morning and around 12:30 PM.  If he takes it later than 1230 or 1-ish, he will have trouble sleeping at night.  His fatigue score is 50 out of 63.  His weight has been more or less stable.  He is working on weight loss.  I reviewed your office note from 08/09/2022. He is followed by psychiatry.  Bedtime is generally between 10:30 PM and 11 PM and rise time is around 7.  He does not have nightly nocturia and denies any recurrent nocturnal or morning headaches.  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  He works 3 days from home and 2 days in the office.  He has 1 dog in the household.  He has a TV in his bedroom but it is not on at night.  His dog sleeps in the crate in his bedroom.  He is single and lives alone, no children.  He smokes cigarettes.  He drinks alcohol about 1 beer per day.  He drinks caffeine in the form of 1 cup of coffee in the morning and 1 soda with lunch typically.  His Past Medical History Is Significant For: Past Medical History:  Diagnosis Date   ADD (attention deficit disorder)    Allergy    Anxiety and depression    Childhood asthma    Pilonidal cyst    Wears contact lenses     His Past Surgical History Is Significant For: Past Surgical History:  Procedure Laterality Date   DENTAL SURGERY     implant   FINGER FRACTURE SURGERY  2006   pinky, boxers fracture   PILONIDAL CYST DRAINAGE  12/2016   WISDOM TOOTH EXTRACTION      His Family History Is Significant For: Family History  Problem Relation Age of Onset   Asthma Mother    Stroke Mother    Arrhythmia Mother    Sleep apnea Mother    Heart disease Mother        arrhythmia   Asthma Brother    Sleep apnea Maternal Grandmother    Diabetes Maternal Grandfather    Diabetes Paternal Grandmother        boarderline   COPD Paternal Grandmother        lung   Alzheimer's disease Paternal Grandfather     Cancer Other        breast, great aunt   Glaucoma Other     His Social History Is Significant For: Social History   Socioeconomic History   Marital status: Single    Spouse name: Not on file   Number of children: Not on file   Years of education: Not on file   Highest education level: Not on file  Occupational History   Not on file  Tobacco Use   Smoking  status: Every Day    Current packs/day: 0.25    Average packs/day: 0.3 packs/day for 15.0 years (3.8 ttl pk-yrs)    Types: Cigarettes   Smokeless tobacco: Former    Types: Chew  Vaping Use   Vaping status: Some Days  Substance and Sexual Activity   Alcohol use: Yes    Alcohol/week: 12.0 standard drinks of alcohol    Types: 12 Cans of beer per week   Drug use: Yes    Types: Marijuana   Sexual activity: Not on file  Other Topics Concern   Not on file  Social History Narrative   Lives alone.  No children.  Working on IT trainer, works for [name], Loss adjuster, chartered.   Exercise -yard work, walking.   07/2022.   Social Determinants of Health   Financial Resource Strain: Not on file  Food Insecurity: Not on file  Transportation Needs: Not on file  Physical Activity: Not on file  Stress: Not on file  Social Connections: Not on file    His Allergies Are:  No Known Allergies:   His Current Medications Are:  Outpatient Encounter Medications as of 01/19/2023  Medication Sig   amphetamine-dextroamphetamine (ADDERALL) 30 MG tablet Take 30 mg by mouth 2 (two) times daily.   buPROPion ER (WELLBUTRIN SR) 100 MG 12 hr tablet Take 100 mg by mouth daily. (Patient not taking: Reported on 01/19/2023)   FLUoxetine (PROZAC) 20 MG tablet Take 20 mg by mouth daily. (Patient not taking: Reported on 01/19/2023)   No facility-administered encounter medications on file as of 01/19/2023.  :  Review of Systems:  Out of a complete 14 point review of systems, all are reviewed and negative with the exception of these symptoms as listed below: Review  of Systems  Neurological:        Patient is here alone for initial follow-up for cpap. He states things are ok but he still doesn't feel like he is getting good rest at night. He ends up taking the machine off around 5 am when he wakes up and will sleep another hour or two. The mask is uncomfortable. The nasal mask did not work well so he does better with the FFM.     Objective:  Neurological Exam  Physical Exam Physical Examination:   Vitals:   01/19/23 0912  BP: 117/72  Pulse: 85    General Examination: The patient is a very pleasant 37 y.o. male in no acute distress. He appears well-developed and well-nourished and well groomed.   HEENT: Normocephalic, atraumatic, pupils are equal, round and reactive to light, tracking well-preserved.  Hearing grossly intact.  Face is symmetric with normal facial animation.  Speech is clear without dysarthria, hypophonia or voice tremor.  Neck with full range of motion, no carotid bruits.   Oropharynx exam reveals: mild mouth dryness, good dental hygiene and moderate airway crowding. Tongue protrudes centrally and palate elevates symmetrically.   Chest: Clear to auscultation without wheezing, rhonchi or crackles noted.   Heart: S1+S2+0, regular and normal without murmurs, rubs or gallops noted.    Abdomen: Soft, non-tender and non-distended.   Extremities: There is no obvious swelling in the distal lower extremities bilaterally.    Skin: Warm and dry without trophic changes noted.    Musculoskeletal: exam reveals no obvious joint deformities.    Neurologically:  Mental status: The patient is awake, alert and oriented in all 4 spheres. His immediate and remote memory, attention, language skills and fund of knowledge are appropriate. There is  no evidence of aphasia, agnosia, apraxia or anomia. Speech is clear with normal prosody and enunciation. Thought process is linear. Mood is normal and affect is normal.  Cranial nerves II - XII are as  described above under HEENT exam.  Motor exam: Normal bulk, strength and tone is noted. There is no obvious action or resting tremor.  Fine motor skills and coordination: grossly intact.  Cerebellar testing: No dysmetria or intention tremor. There is no truncal or gait ataxia.  Sensory exam: intact to light touch in the upper and lower extremities.  Gait, station and balance: He stands easily. No veering to one side is noted. No leaning to one side is noted. Posture is age-appropriate and stance is narrow based. Gait shows normal stride length and normal pace. No problems turning are noted.    Assessment and Plan:  In summary, Malik Becker is a 37 year old male with an underlying medical history of ADD, anxiety, depression, allergies, history of childhood asthma, and borderline obesity, who presents for follow-up consultation of his obstructive sleep apnea, after interim testing and starting home autoPAP therapy. He had a home sleep test through our office on 11/06/2022 which showed moderate obstructive sleep apnea with an AHI of 16.4/h, O2 nadir 82% with variable snoring detected.  We reviewed his home sleep test results in detail today.  He has been on home AutoPap therapy since 11/22/2022.  He has a ResMed air sense 11 AutoSet machine, his DME company is [employer].  He is currently using a F20 fullface mask from ResMed.  He could not tolerate nasal pillows or nasal cushion style interfaces.  He is struggling with adherence at this time, still adjusting to treatment, tries to sleep on his back is much as possible.  We talked about his compliance data in detail as well.  He is commended for his trial so far, he is encouraged to stay consistent with AutoPap therapy and maybe trial a different mask, it was able to provide him with a sample for the ResMed F40 starter pack.  He is willing to try it.  Another fullface mask style he may like is the [employer] from [place] or [place] from [place].   They have an additional piece on the forehead that helps secure the mask.  He is encouraged to think about it, he can always discuss this further with his DME provider.  For now he is motivated to continue with treatment.  He has noticed a modest benefit so far.  He is advised to follow-up routinely in this clinic to see one of our nurse practitioners in 6 months but we will review his compliance data in about a month.  He is encouraged to send [place] a MyChart message as a reminder.  I answered all his questions today and he was in agreement with our plan. I spent 40 minutes in total face-to-face time and in reviewing records during pre-charting, more than 50% of which was spent in counseling and coordination of care, reviewing test results, reviewing medications and treatment regimen and/or in discussing or reviewing the diagnosis of OSA, difficulty with AutoPap usage, the prognosis and treatment options. Pertinent laboratory and imaging test results that were available during this visit with the patient were reviewed by me and considered in my medical decision making (see chart for details).

## 2023-07-27 ENCOUNTER — Telehealth: Payer: Self-pay

## 2023-07-27 NOTE — Telephone Encounter (Signed)
 Contacted pt regarding appt tomorrow due to noncompliance of CPAP. LVM rq call back.

## 2023-07-28 NOTE — Progress Notes (Deleted)
 [name] [address] [place]. [place] [address] [phone]       OFFICE FOLLOW UP NOTE  Mr. Malik Becker Date of Birth:  1986/02/03 Medical Record Number:  [id number]    Primary neurologist: Dr. Frances Furbish Reason for visit: CPAP follow-up    SUBJECTIVE:   CHIEF COMPLAINT:  No chief complaint on file.   Follow-up visit:  Prior visit: 01/19/2023  Brief HPI:   Malik Becker is a 38 y.o. male who was initially evaluated by Dr. Frances Furbish in 08/2022 for concern of underlying sleep apnea with reports of snoring, excessive daytime somnolence, fragmented sleep and witnessed apneas.  ESS 10/24 although this is on Adderall, ESS estimated to be more than 15/24 if not on Adderall.  HST 10/2022 showed moderate sleep apnea with total AHI of 16.4/h and O2 nadir of 82%.  AutoPap initiated 11/2022.  At prior visit with Dr. Frances Furbish, noted suboptimal compliance although optimal residual AHI with use.  Reported difficulty with nasal mask tolerance therefore switched to fullface mask which is more tolerated, noted difficulty sleeping on his side with mask.  He was provided a sample ResMed F40 starter pack or consider other masks for better tolerance.      Interval history:           ROS:   14 system review of systems performed and negative with exception of those listed in HPI  PMH:  Past Medical History:  Diagnosis Date   ADD (attention deficit disorder)    Allergy    Anxiety and depression    Childhood asthma    Pilonidal cyst    Wears contact lenses     PSH:  Past Surgical History:  Procedure Laterality Date   DENTAL SURGERY     implant   FINGER FRACTURE SURGERY  2006   pinky, boxers fracture   PILONIDAL CYST DRAINAGE  12/2016   WISDOM TOOTH EXTRACTION      Social History:  Social History   Socioeconomic History   Marital status: Single    Spouse name: Not on file   Number of children: Not on file   Years of education: Not on file   Highest  education level: Not on file  Occupational History   Not on file  Tobacco Use   Smoking status: Every Day    Current packs/day: 0.25    Average packs/day: 0.3 packs/day for 15.0 years (3.8 ttl pk-yrs)    Types: Cigarettes   Smokeless tobacco: Former    Types: Engineer, drilling   Vaping status: Some Days  Substance and Sexual Activity   Alcohol use: Yes    Alcohol/week: 12.0 standard drinks of alcohol    Types: 12 Cans of beer per week   Drug use: Yes    Types: Marijuana   Sexual activity: Not on file  Other Topics Concern   Not on file  Social History Narrative   Lives alone.  No children.  Working on IT trainer, works for [name], Loss adjuster, chartered.   Exercise -yard work, walking.   07/2022.   Social Drivers of Corporate investment banker Strain: Not on file  Food Insecurity: Not on file  Transportation Needs: Not on file  Physical Activity: Not on file  Stress: Not on file  Social Connections: Not on file  Intimate Partner Violence: Not on file    Family History:  Family History  Problem Relation Age of Onset   Asthma Mother    Stroke Mother  Arrhythmia Mother    Sleep apnea Mother    Heart disease Mother        arrhythmia   Asthma Brother    Sleep apnea Maternal Grandmother    Diabetes Maternal Grandfather    Diabetes Paternal Grandmother        boarderline   COPD Paternal Grandmother        lung   Alzheimer's disease Paternal Grandfather    Cancer Other        breast, great aunt   Glaucoma Other     Medications:   Current Outpatient Medications on File Prior to Visit  Medication Sig Dispense Refill   amphetamine-dextroamphetamine (ADDERALL) 30 MG tablet Take 30 mg by mouth 2 (two) times daily.     buPROPion ER (WELLBUTRIN SR) 100 MG 12 hr tablet Take 100 mg by mouth daily. (Patient not taking: Reported on 01/19/2023)     FLUoxetine (PROZAC) 20 MG tablet Take 20 mg by mouth daily. (Patient not taking: Reported on 01/19/2023)     No current facility-administered  medications on file prior to visit.    Allergies:  No Known Allergies    OBJECTIVE:  Physical Exam  There were no vitals filed for this visit. There is no height or weight on file to calculate BMI. No results found.   General: well developed, well nourished, seated, in no evident distress Head: head normocephalic and atraumatic.   Neck: supple with no carotid or supraclavicular bruits Cardiovascular: regular rate and rhythm, no murmurs Musculoskeletal: no deformity Skin:  no rash/petichiae Vascular:  Normal pulses all extremities   Neurologic Exam Mental Status: Awake and fully alert. Oriented to place and time. Recent and remote memory intact. Attention span, concentration and fund of knowledge appropriate. Mood and affect appropriate.  Cranial Nerves: Pupils equal, briskly reactive to light. Extraocular movements full without nystagmus. Visual fields full to confrontation. Hearing intact. Facial sensation intact. Face, tongue, palate moves normally and symmetrically.  Motor: Normal bulk and tone. Normal strength in all tested extremity muscles Gait and Station: Arises from chair without difficulty. Stance is normal. Gait demonstrates normal stride length and balance without use of AD. Tandem walk and heel toe without difficulty.         ASSESSMENT/PLAN: Malik Becker is a 38 y.o. year old male    OSA on CPAP : Compliance report shows satisfactory usage with optimal residual AHI.  Continue current pressure settings.  Discussed continued nightly usage with ensuring greater than 4 hours nightly for optimal benefit and per insurance purposes.  Continue to follow with DME company for any needed supplies or CPAP related concerns     Follow up in *** or call earlier if needed   CC:  PCP: Tysinger, Kermit Balo, PA-C    I spent *** minutes of face-to-face and non-face-to-face time with patient.  This included previsit chart review, lab review, study review, order entry,  electronic health record documentation, patient education and discussion regarding above diagnoses and treatment plan and answered all other questions to patient's satisfaction    Ihor Austin, [facility]  [facility] [address] [place], [place] [address]  Phone [phone] Fax [phone] Note: This document was prepared with digital dictation and possible smart phrase technology. Any transcriptional errors that result from this process are unintentional.

## 2023-07-31 ENCOUNTER — Encounter: Payer: Self-pay | Admitting: Adult Health

## 2023-07-31 ENCOUNTER — Ambulatory Visit: Payer: 59 | Admitting: Adult Health
